# Patient Record
Sex: Female | Born: 1990 | Race: White | Hispanic: No | Marital: Single | Smoking: Never smoker
Health system: Southern US, Community
[De-identification: ages and names within clinical notes are randomized; demographics above are authoritative.]

## PROBLEM LIST (undated history)

## (undated) DIAGNOSIS — F419 Anxiety disorder, unspecified: Secondary | ICD-10-CM

## (undated) DIAGNOSIS — A499 Bacterial infection, unspecified: Secondary | ICD-10-CM

## (undated) DIAGNOSIS — N390 Urinary tract infection, site not specified: Secondary | ICD-10-CM

## (undated) DIAGNOSIS — E785 Hyperlipidemia, unspecified: Secondary | ICD-10-CM

## (undated) DIAGNOSIS — K859 Acute pancreatitis without necrosis or infection, unspecified: Secondary | ICD-10-CM

## (undated) DIAGNOSIS — E7800 Pure hypercholesterolemia, unspecified: Secondary | ICD-10-CM

## (undated) HISTORY — DX: Hyperlipidemia, unspecified: E78.5

## (undated) HISTORY — DX: Acute pancreatitis without necrosis or infection, unspecified: K85.90

## (undated) HISTORY — DX: Urinary tract infection, site not specified: A49.9

## (undated) HISTORY — DX: Anxiety disorder, unspecified: F41.9

## (undated) HISTORY — DX: Urinary tract infection, site not specified: N39.0

---

## 2010-10-29 ENCOUNTER — Inpatient Hospital Stay (HOSPITAL_COMMUNITY)
Admission: AD | Admit: 2010-10-29 | Discharge: 2010-10-30 | Disposition: A | Discharge status: Home / Self Care | Payer: 59 | Source: Ambulatory Visit | Attending: Obstetrics & Gynecology | Admitting: Obstetrics & Gynecology

## 2010-10-29 DIAGNOSIS — N949 Unspecified condition associated with female genital organs and menstrual cycle: Secondary | ICD-10-CM | POA: Insufficient documentation

## 2010-10-29 DIAGNOSIS — N938 Other specified abnormal uterine and vaginal bleeding: Secondary | ICD-10-CM | POA: Insufficient documentation

## 2010-10-29 LAB — URINALYSIS, ROUTINE W REFLEX MICROSCOPIC
Leukocytes, UA: NEGATIVE
Nitrite: NEGATIVE
Protein, ur: NEGATIVE mg/dL
Specific Gravity, Urine: 1.025 (ref 1.005–1.030)
Urobilinogen, UA: 1 mg/dL (ref 0.0–1.0)

## 2010-10-29 LAB — URINE MICROSCOPIC-ADD ON

## 2010-10-30 LAB — WET PREP, GENITAL: Yeast Wet Prep HPF POC: NONE SEEN

## 2010-11-01 LAB — GC/CHLAMYDIA PROBE AMP, GENITAL
Chlamydia, DNA Probe: NEGATIVE
GC Probe Amp, Genital: NEGATIVE

## 2010-12-19 ENCOUNTER — Inpatient Hospital Stay (HOSPITAL_COMMUNITY)
Admission: AD | Admit: 2010-12-19 | Discharge: 2010-12-19 | Disposition: A | Discharge status: Other Acute Inpatient Hospital | Payer: 59 | Source: Ambulatory Visit | Attending: Obstetrics and Gynecology | Admitting: Obstetrics and Gynecology

## 2010-12-19 ENCOUNTER — Inpatient Hospital Stay (HOSPITAL_COMMUNITY): Payer: 59

## 2010-12-19 ENCOUNTER — Inpatient Hospital Stay (HOSPITAL_COMMUNITY)
Admission: EM | Admit: 2010-12-19 | Discharge: 2010-12-24 | DRG: 440 | Disposition: A | Discharge status: Home / Self Care | Payer: 59 | Attending: Internal Medicine | Admitting: Internal Medicine

## 2010-12-19 DIAGNOSIS — R109 Unspecified abdominal pain: Secondary | ICD-10-CM | POA: Insufficient documentation

## 2010-12-19 DIAGNOSIS — R509 Fever, unspecified: Secondary | ICD-10-CM | POA: Diagnosis present

## 2010-12-19 DIAGNOSIS — K859 Acute pancreatitis without necrosis or infection, unspecified: Principal | ICD-10-CM | POA: Diagnosis present

## 2010-12-19 DIAGNOSIS — E781 Pure hyperglyceridemia: Secondary | ICD-10-CM | POA: Diagnosis present

## 2010-12-19 DIAGNOSIS — R197 Diarrhea, unspecified: Secondary | ICD-10-CM | POA: Diagnosis not present

## 2010-12-19 DIAGNOSIS — F341 Dysthymic disorder: Secondary | ICD-10-CM | POA: Diagnosis present

## 2010-12-19 DIAGNOSIS — D72829 Elevated white blood cell count, unspecified: Secondary | ICD-10-CM | POA: Diagnosis present

## 2010-12-19 LAB — COMPREHENSIVE METABOLIC PANEL
Alkaline Phosphatase: 50 U/L (ref 39–117)
BUN: 9 mg/dL (ref 6–23)
Chloride: 106 mEq/L (ref 96–112)
Creatinine, Ser: 0.68 mg/dL (ref 0.4–1.2)
Glucose, Bld: 122 mg/dL — ABNORMAL HIGH (ref 70–99)
Potassium: 4.1 mEq/L (ref 3.5–5.1)
Total Bilirubin: 0.6 mg/dL (ref 0.3–1.2)

## 2010-12-19 LAB — DIFFERENTIAL
Band Neutrophils: 4 % (ref 0–10)
Basophils Absolute: 0 10*3/uL (ref 0.0–0.1)
Basophils Relative: 0 % (ref 0–1)
Blasts: 0 %
Lymphocytes Relative: 13 % (ref 12–46)
Lymphs Abs: 2.6 10*3/uL (ref 0.7–4.0)
Monocytes Absolute: 0.2 10*3/uL (ref 0.1–1.0)
Monocytes Relative: 1 % — ABNORMAL LOW (ref 3–12)
Neutro Abs: 17.5 10*3/uL — ABNORMAL HIGH (ref 1.7–7.7)
Neutrophils Relative %: 82 % — ABNORMAL HIGH (ref 43–77)
Promyelocytes Absolute: 0 %

## 2010-12-19 LAB — URINE MICROSCOPIC-ADD ON

## 2010-12-19 LAB — CBC
MCH: 30.2 pg (ref 26.0–34.0)
MCHC: 35.6 g/dL (ref 30.0–36.0)
MCV: 84.7 fL (ref 78.0–100.0)
Platelets: 267 10*3/uL (ref 150–400)
RBC: 4.51 MIL/uL (ref 3.87–5.11)

## 2010-12-19 LAB — URINALYSIS, ROUTINE W REFLEX MICROSCOPIC
Glucose, UA: NEGATIVE mg/dL
Ketones, ur: >80 mg/dL — AB
Leukocytes, UA: NEGATIVE
Nitrite: NEGATIVE
Protein, ur: NEGATIVE mg/dL
Urobilinogen, UA: 0.2 mg/dL (ref 0.0–1.0)

## 2010-12-19 LAB — LIPASE, BLOOD: Lipase: 264 U/L — ABNORMAL HIGH (ref 11–59)

## 2010-12-19 LAB — POCT PREGNANCY, URINE: Preg Test, Ur: NEGATIVE

## 2010-12-19 LAB — RAPID URINE DRUG SCREEN, HOSP PERFORMED
Barbiturates: NOT DETECTED
Benzodiazepines: NOT DETECTED

## 2010-12-20 ENCOUNTER — Emergency Department (HOSPITAL_COMMUNITY): Payer: 59

## 2010-12-20 ENCOUNTER — Encounter (HOSPITAL_COMMUNITY): Payer: Self-pay | Admitting: Radiology

## 2010-12-20 DIAGNOSIS — K859 Acute pancreatitis without necrosis or infection, unspecified: Secondary | ICD-10-CM

## 2010-12-20 LAB — COMPREHENSIVE METABOLIC PANEL
AST: 20 U/L (ref 0–37)
CO2: 21 mEq/L (ref 19–32)
Chloride: 105 mEq/L (ref 96–112)
Creatinine, Ser: 0.8 mg/dL (ref 0.4–1.2)
GFR calc Af Amer: >60 mL/min (ref >60)
GFR calc non Af Amer: >60 mL/min (ref >60)
Glucose, Bld: 129 mg/dL — ABNORMAL HIGH (ref 70–99)
Total Bilirubin: 1 mg/dL (ref 0.3–1.2)

## 2010-12-20 LAB — LIPID PANEL
Cholesterol: 913 mg/dL — ABNORMAL HIGH (ref 0–200)
Triglycerides: 9164 mg/dL — ABNORMAL HIGH (ref <150)

## 2010-12-20 LAB — DIFFERENTIAL
Basophils Absolute: 0 10*3/uL (ref 0.0–0.1)
Eosinophils Absolute: 0 10*3/uL (ref 0.0–0.7)
Lymphocytes Relative: 12 % (ref 12–46)
Monocytes Absolute: 1.1 10*3/uL — ABNORMAL HIGH (ref 0.1–1.0)
Neutrophils Relative %: 81 % — ABNORMAL HIGH (ref 43–77)

## 2010-12-20 LAB — CBC
MCV: 86.4 fL (ref 78.0–100.0)
Platelets: 209 10*3/uL (ref 150–400)
RDW: 13.5 % (ref 11.5–15.5)
WBC: 16.3 10*3/uL — ABNORMAL HIGH (ref 4.0–10.5)

## 2010-12-20 LAB — PHOSPHORUS: Phosphorus: 3.5 mg/dL (ref 2.3–4.6)

## 2010-12-20 MED ORDER — IOHEXOL 300 MG/ML  SOLN
100.0000 mL | Freq: Once | INTRAMUSCULAR | Status: AC | PRN
  Administered 2010-12-20: 100 mL via INTRAVENOUS

## 2010-12-21 ENCOUNTER — Inpatient Hospital Stay (HOSPITAL_COMMUNITY): Payer: 59

## 2010-12-21 DIAGNOSIS — K859 Acute pancreatitis without necrosis or infection, unspecified: Secondary | ICD-10-CM

## 2010-12-21 LAB — CBC
Hemoglobin: 10.1 g/dL — ABNORMAL LOW (ref 12.0–15.0)
MCH: 29.9 pg (ref 26.0–34.0)
MCV: 89.6 fL (ref 78.0–100.0)
RBC: 3.38 MIL/uL — ABNORMAL LOW (ref 3.87–5.11)
WBC: 15.7 10*3/uL — ABNORMAL HIGH (ref 4.0–10.5)

## 2010-12-21 LAB — COMPREHENSIVE METABOLIC PANEL
AST: 31 U/L (ref 0–37)
Alkaline Phosphatase: 36 U/L — ABNORMAL LOW (ref 39–117)
BUN: 5 mg/dL — ABNORMAL LOW (ref 6–23)
CO2: 14 mEq/L — ABNORMAL LOW (ref 19–32)
Chloride: 111 mEq/L (ref 96–112)
Creatinine, Ser: 0.48 mg/dL (ref 0.4–1.2)
GFR calc non Af Amer: >60 mL/min (ref >60)
Potassium: 4.8 mEq/L (ref 3.5–5.1)
Total Bilirubin: 1.8 mg/dL — ABNORMAL HIGH (ref 0.3–1.2)

## 2010-12-21 LAB — MAGNESIUM: Magnesium: 2.2 mg/dL (ref 1.5–2.5)

## 2010-12-21 LAB — TRIGLYCERIDES: Triglycerides: 1321 mg/dL — ABNORMAL HIGH (ref <150)

## 2010-12-22 DIAGNOSIS — K859 Acute pancreatitis without necrosis or infection, unspecified: Secondary | ICD-10-CM

## 2010-12-22 LAB — URINE CULTURE
Culture  Setup Time: 201204040627
Culture: NO GROWTH
Special Requests: NEGATIVE

## 2010-12-22 LAB — BASIC METABOLIC PANEL
BUN: 1 mg/dL — ABNORMAL LOW (ref 6–23)
Chloride: 104 mEq/L (ref 96–112)
GFR calc Af Amer: >60 mL/min (ref >60)
GFR calc non Af Amer: >60 mL/min (ref >60)
Potassium: 3.8 mEq/L (ref 3.5–5.1)
Sodium: 132 mEq/L — ABNORMAL LOW (ref 135–145)

## 2010-12-22 LAB — DIFFERENTIAL
Basophils Absolute: 0 10*3/uL (ref 0.0–0.1)
Eosinophils Absolute: 0.2 10*3/uL (ref 0.0–0.7)
Eosinophils Relative: 1 % (ref 0–5)
Lymphs Abs: 2.9 10*3/uL (ref 0.7–4.0)
Neutrophils Relative %: 76 % (ref 43–77)

## 2010-12-22 LAB — CBC
MCV: 89.6 fL (ref 78.0–100.0)
Platelets: 189 10*3/uL (ref 150–400)
RBC: 3.65 MIL/uL — ABNORMAL LOW (ref 3.87–5.11)
RDW: 14.3 % (ref 11.5–15.5)
WBC: 17.1 10*3/uL — ABNORMAL HIGH (ref 4.0–10.5)

## 2010-12-23 LAB — CBC
Hemoglobin: 9.5 g/dL — ABNORMAL LOW (ref 12.0–15.0)
MCH: 28.1 pg (ref 26.0–34.0)
MCV: 90.2 fL (ref 78.0–100.0)
Platelets: 188 10*3/uL (ref 150–400)
RBC: 3.38 MIL/uL — ABNORMAL LOW (ref 3.87–5.11)
WBC: 13.3 10*3/uL — ABNORMAL HIGH (ref 4.0–10.5)

## 2010-12-24 LAB — BASIC METABOLIC PANEL
BUN: 4 mg/dL — ABNORMAL LOW (ref 6–23)
CO2: 26 mEq/L (ref 19–32)
Chloride: 102 mEq/L (ref 96–112)
GFR calc Af Amer: >60 mL/min (ref >60)
Potassium: 4.5 mEq/L (ref 3.5–5.1)

## 2010-12-24 LAB — CBC
Hemoglobin: 10.1 g/dL — ABNORMAL LOW (ref 12.0–15.0)
MCH: 27.6 pg (ref 26.0–34.0)
MCV: 88.8 fL (ref 78.0–100.0)
Platelets: 226 10*3/uL (ref 150–400)
RBC: 3.66 MIL/uL — ABNORMAL LOW (ref 3.87–5.11)
WBC: 16.6 10*3/uL — ABNORMAL HIGH (ref 4.0–10.5)

## 2010-12-24 LAB — LIPASE, BLOOD: Lipase: 70 U/L — ABNORMAL HIGH (ref 11–59)

## 2010-12-27 LAB — CULTURE, BLOOD (ROUTINE X 2)
Culture  Setup Time: 201204040907
Culture  Setup Time: 201204040907
Culture: NO GROWTH

## 2010-12-28 NOTE — H&P (Signed)
Julie Madden, Julie Madden                  ACCOUNT NO.:  [id number]  MEDICAL RECORD NO.:  [id number]           PATIENT TYPE:  E  LOCATION:  WLED                         FACILITY:  [facility]  PHYSICIAN:  Michiel Cowboy, MDDATE OF BIRTH:  November 04, 1990  DATE OF ADMISSION:  12/19/2010 DATE OF DISCHARGE:                             HISTORY & PHYSICAL   PRIMARY CARE PHYSICIAN:  None.  She goes usually for only OB-GYN care.  CHIEF COMPLAINT:  Abdominal pain.  HISTORY OF PRESENT ILLNESS:  The patient is a 20 year old female who developed sudden epigastric/right upper quadrant pain at 4 a.m. yesterday night and has been going all day.  She tried to eat something but that has made it only worse.  She had an episode of vomiting following this.  Eventually, she presented to [facility] where they did a pregnancy test on her and she was not pregnant and sent her over to the emergency department.  In the ED, she had a lipase done which was elevated at 260.  She got abdominal ultrasound which did not show any abnormality involving the pancreas but did show slight amount of pericholecystic fluid, although no abnormality within the gallbladder itself.  No biliary dilatation, at which point, [facility] hospitalist was called for admission.  At this point, the patient is going to go there for her CT scan to have further evaluation for pancreatitis.  REVIEW OF SYSTEMS:  She has not had any fever.  She had had nausea and vomiting.  No chest pains or shortness of breath.  Otherwise, review of systems is negative.  PAST MEDICAL HISTORY:  Significant for anxiety and depression.  SOCIAL HISTORY:  The patient denies alcohol or drug abuse.  Her last drink may have been 2 months ago if not more.  She does not smoke.  FAMILY HISTORY:  Noncontributory.  No coronary artery disease, no diabetes.  No history of pancreatitis.  ALLERGIES:  WELLBUTRIN.  MEDICATIONS:  She takes birth control pills currently.   She was taking Wellbutrin and Celexa, but  stopped.  PHYSICAL EXAMINATION:  VITALS:  Temperature 99.3, blood pressure 110/70, pulse 125, respirations 20, satting 98% room air. GENERAL:  The patient appears to be in no acute distress. HEENT:  Head nontraumatic.  Dry mucous membranes with normal skin turgor. LUNGS:  Clear to auscultation bilaterally. HEART:  Regular rate and rhythm.  No murmurs appreciated. ABDOMEN:  Soft.  There is epigastric tenderness as well as right-sided tenderness. LOWER EXTREMITIES:  Without clubbing, cyanosis, or edema. NEUROLOGIC:  Intact.  LABORATORY DATA:  White blood cell count elevated at 20, hemoglobin 13.6.  Sodium 138, potassium 4.1 creatinine 0.7.  LFTs within normal limits.  Albumin 3.5, calcium 7.8, lipase 264.  U tox unremarkable.  UA unremarkable.  KUB within normal limits.  US showing normal gallbladder wall with some pericholecystic fluid, but no abnormality of pancreas or gallbladder itself.  ASSESSMENT AND PLAN:  This is a 20 year old female with still possibility of pancreatitis given the epigastric pain and elevated lipase but to confirm this, I will obtain CT of her abdomen and pelvis to rule out  any other possibility.  We will make her n.p.o. except for ice chips and give aggressive intravenous fluids.  If CT scan of the abdomen showing any intra-abdominal infectious process, we will cover with antibiotics but for right now will hold off until CT scan is back but also if there is any evidence of necrotizing pancreatitis, we will also need to cover antibiotics as well. 1. Leukocytosis, could be combination of being reactive versus     hemoconcentration.  Watch for any source of infection. 2. Prophylaxis, Protonix and sequential compression devices.     Michiel Cowboy, MD     AVD/MEDQ  D:  12/20/2010  T:  12/20/2010  Job:  [id number]  Electronically Signed by Therisa Doyne MD on 12/28/2010 08:42:50 PM

## 2011-01-04 NOTE — Discharge Summary (Signed)
Julie Madden, Julie Madden                  ACCOUNT NO.:  [id number]  MEDICAL RECORD NO.:  [id number]           PATIENT TYPE:  I  LOCATION:  1343                         FACILITY:  [facility]  PHYSICIAN:  Clydia Llano, MD       DATE OF BIRTH:  1991-08-17  DATE OF ADMISSION:  12/19/2010 DATE OF DISCHARGE:  12/24/2010                              DISCHARGE SUMMARY   PRIMARY OBSTETRIC PHYSICIAN:  Zelphia Cairo, M.D.  REASON FOR ADMISSION:  Abdominal pain.  DISCHARGE DIAGNOSES: 1. Acute pancreatitis. 2. Hypertriglyceridemia. 3. Fever thought to be secondary to the pancreatitis.  DISCHARGE MEDICATIONS: 1. Lopid 600 mg p.o. b.i.d. before meals. 2. Oxycodone/acetaminophen 5/325 mg one to two tablets every 6 hours     as needed for pain. 3. Fish oil one capsule p.o. daily. 4. Clear Eyes OTC two drops in both eyes twice daily.  Stop taking the following medication: 1. Tri-Sprintec one tablet p.o. daily.  RADIOLOGY: 1. Chest x-ray April 4 showed linear opacity in the left lung base,     otherwise normal lungs. 2. CT of the abdomen and pelvis April 3 showed finding consistent with     acute pancreatitis with peripancreatic exudate, edema of the     ascending colon consistent with adjacent inflammatory process. 3. Abdominal ultrasound April 3 showed there is trace amount of     pericholecystic fluid.  The gallbladder is otherwise normal without     pain, stones, sludge or wall thickening.  BRIEF HISTORY AND EXAMINATION:  Julie Madden is a 20 year old female with no significant past medical history who came into the hospital because of abdominal pain.  The patient developed sudden epigastric right upper quadrant abdominal pain at 4:00 in the morning and yesterday night.  The patient came into the hospital complaining about the worsening of the pain.  The patient has nausea, vomiting and abdominal pain.  Initially she presented to the [facility] which a pregnancy test was done first and it  was negative.  Then the patient was transferred to [facility] for further evaluation.  Upon initial evaluation in the Emergency Department, lipase was found to be 260.  CT scan confirmed the presence of pancreatitis with no other abnormalities like cholecystitis or appendicitis.  The patient admitted for further evaluation.  BRIEF HOSPITAL COURSE: 1. The patient was admitted.  She was put n.p.o. for bowel rest.     Aggressive hydration with IV fluids were started.  Pain control     with IV pain medication was initiated as well.  Also the     Gastroenterology Service was consulted because of the patient's     young age.  The patient, with the n.p.o. her lipase was going down     nicely.  The abdominal pain as well as the nausea and the vomiting     also subsiding.  The patient was started on clear liquids and     upgraded slowly to a full diet.  On the date of discharge she is     tolerating a full diet since yesterday.  Her lipase is still  minimally high at 70, but the patient's pain all subsided.  The     patient does not drink, does not have any evidence of gallstones or     any increased LFTs.  Her lipid studies showed a triglyceride level     of 9164.  The acute pancreatitis was thought to be secondary to     hypertriglyceridemia. 2. Hypertriglyceridemia.  The patient does not have family history of     hypertriglyceridemia.  She is not diabetic.  Her TSH is normal.     The patient does not drink alcohol.  The only thing that could     alter the triglycerides is that the patient is on estrogen-     containing birth control pills.  The patient instructed not to take     it as well as she was started on Lopid in the hospital.  Her     triglycerides in one day went to 1300 and the day before discharge     it was 335.  The patient needs outpatient followup with her     triglycerides.  The patient will be on Lopid for one month and she     was instructed to follow up with  an Internal Medicine physician as     a primary care doctor.  She should stop the Lopid after one month     and check the triglycerides.  If the patient's triglycerides are     still high, the patient might need an endocrinologist referral for     further evaluation. 3. Fever.  While the patient was in the hospital, the patient     developed one spike of fever of 102.2 on April 4.  At that time     Gastroenterology was seeing the patient.  They recommended not to     use antibiotics and the patient's fever was thought to be related     to the pancreatitis.  The patient afterwards had a low-grade fever     of around 100 for one day.  On the day of discharge her temperature     is normal around 98.5.  DISCHARGE INSTRUCTIONS: 1. Diet:  Low-fat diet. 2. Activity:  As tolerated. 3. Disposition:  Home.     Clydia Llano, MD     ME/MEDQ  D:  12/24/2010  T:  12/24/2010  Job:  [id number]  cc:   Zelphia Cairo, MD Fax: [phone]  Electronically Signed by Clydia Llano  on 01/04/2011 03:49:16 PM

## 2011-01-06 ENCOUNTER — Encounter: Payer: Self-pay | Admitting: Internal Medicine

## 2011-01-06 ENCOUNTER — Ambulatory Visit (INDEPENDENT_AMBULATORY_CARE_PROVIDER_SITE_OTHER): Payer: 59 | Admitting: Internal Medicine

## 2011-01-06 VITALS — BP 106/78 | HR 80 | Temp 97.5°F | Wt 153.0 lb

## 2011-01-06 DIAGNOSIS — E781 Pure hyperglyceridemia: Secondary | ICD-10-CM

## 2011-01-06 MED ORDER — FLUOXETINE HCL 20 MG PO CAPS: 20.0000 mg | ORAL_CAPSULE | Freq: Every day | ORAL | Status: DC

## 2011-01-06 MED ORDER — ALPRAZOLAM 0.5 MG PO TABS: 0.5000 mg | ORAL_TABLET | Freq: Three times a day (TID) | ORAL | Status: DC | PRN

## 2011-01-06 NOTE — Progress Notes (Signed)
HPI: Julie Madden presents to establish for on-going primary care. She was recently hospitalized for pancreatitis secondary to extremely high triglycerides - 9,000+. She made an uneventful recovery. OCP's were thought to be the primary cause of her hypertrglyceridemia. She will be returning to her gyn for change in management of birth control.   Past Medical History  Diagnosis Date  . Anxiety     situational. Did not tolerate wellbutrin. Celexa caused myalgia  . Hyperlipidemia     triglycerides to 9000  . Urinary tract bacterial infections     frequent  . Metabolic pancreatitis    No past surgical history on file. Family History  Problem Relation Age of Onset  . Depression Mother     anxiety  . Cancer Mother     ovarian; gastric cancer  . Arthritis Father   . Asthma Sister   . Asthma Brother   . Diabetes Neg Hx   . Heart disease Neg Hx   . Hyperlipidemia Neg Hx   . Asthma Sister    History   Social History  . Marital Status: Single    Spouse Name: N/A    Number of Children: N/A  . Years of Education: N/A   Occupational History  . Not on file.   Social History Main Topics  . Smoking status: Never Smoker   . Smokeless tobacco: Not on file  . Alcohol Use: Not on file  . Drug Use: Not on file  . Sexually Active: Yes -- Female partner(s)   Other Topics Concern  . Not on file   Social History Narrative   HSG.  Single. 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     ROS General - negative. HEENT - wears glasses, near-sighted. No hearing trouble. No dnetal problems. Respiratory - negative. Heart - normal. GI- good appetite, healthy foods. Bowels regular. MSK - normal, has a click in the right knee.   Physical Exam: WNWD white woman in no distress HEENT- [place]/at, C&S clear with icterus, oropharynx witout lesions Chest - no deformity Lungs - Clear to exam Cor RRR, no murmurs Abdomen - BS+, soft, no guarding or rebound or significant tenderness.  Assessment and Plan 1.  Pancreatitis - patient was sent home on Lopid bid. She had an amylase of 300+ at discharge.  Plan - follow-up lab April 30th with recommendations to follow  2. Birth control - she will see her gynecologist to discuss alternative OCPs or other effective methods of birth control.  3. Chronic anxiety - she has failed celexa-GI problems and wellbutrin - adverse reaction. She admits to significant chronic anxiety with occasional panic.  Plan - fluoxetine 20mg  qd with follow-up in 4 weeks. She is to call for any adverse reaction or intolerance           Alprazolam 0.5 mg to take prn breakthrough anxiety and panic.   4. Health Maintenance - she has had recent thorough evaluation with no problems other than her pancreatitis and elevated triglycerides.

## 2011-01-16 ENCOUNTER — Other Ambulatory Visit (INDEPENDENT_AMBULATORY_CARE_PROVIDER_SITE_OTHER): Payer: 59

## 2011-01-16 DIAGNOSIS — E781 Pure hyperglyceridemia: Secondary | ICD-10-CM

## 2011-01-16 LAB — LIPID PANEL
Cholesterol: 195 mg/dL (ref 0–200)
HDL: 36.3 mg/dL — ABNORMAL LOW (ref >39.00)
LDL Cholesterol: 141 mg/dL — ABNORMAL HIGH (ref 0–99)
VLDL: 18 mg/dL (ref 0.0–40.0)

## 2011-01-16 LAB — HEPATIC FUNCTION PANEL
ALT: 22 U/L (ref 0–35)
Total Protein: 7.5 g/dL (ref 6.0–8.3)

## 2011-01-23 ENCOUNTER — Encounter: Payer: Self-pay | Admitting: Internal Medicine

## 2011-01-23 ENCOUNTER — Telehealth: Payer: Self-pay | Admitting: *Deleted

## 2011-01-23 NOTE — Telephone Encounter (Signed)
Labs were good, triglycerides were 90/. Letter went out today

## 2011-01-23 NOTE — Telephone Encounter (Signed)
Pt's mother left vm req results of last labs on 4/30. I don't believe that Mother is on Hippa - will call pt w/results, please advise.

## 2011-01-23 NOTE — Telephone Encounter (Signed)
Left mess to call office back.   

## 2011-01-24 NOTE — Telephone Encounter (Signed)
Patient informed. 

## 2011-02-06 ENCOUNTER — Ambulatory Visit: Payer: 59 | Admitting: Internal Medicine

## 2011-02-06 DIAGNOSIS — Z0289 Encounter for other administrative examinations: Secondary | ICD-10-CM

## 2011-02-28 ENCOUNTER — Telehealth: Payer: Self-pay | Admitting: *Deleted

## 2011-02-28 NOTE — Telephone Encounter (Signed)
Refill request from [facility] in [place], [place]. Alprazolam 0.5 mg qty 30 Directions: Take 1 tablet tid as needed for sleep and anxiety. Last fill was 02/05/2011. Please Advise refills

## 2011-02-28 NOTE — Telephone Encounter (Signed)
Ok for #60, 3 refills 

## 2011-03-02 MED ORDER — ALPRAZOLAM 0.5 MG PO TABS: 0.5000 mg | ORAL_TABLET | Freq: Three times a day (TID) | ORAL | Status: DC | PRN

## 2011-04-07 ENCOUNTER — Other Ambulatory Visit: Payer: Self-pay | Admitting: Internal Medicine

## 2011-05-02 ENCOUNTER — Ambulatory Visit: Payer: 59 | Admitting: Internal Medicine

## 2011-05-10 ENCOUNTER — Ambulatory Visit: Payer: 59 | Admitting: Internal Medicine

## 2011-05-31 ENCOUNTER — Telehealth: Payer: Self-pay | Admitting: *Deleted

## 2011-05-31 NOTE — Telephone Encounter (Signed)
Fax from [facility] In [place], [place] requesting refill on pts alprazolam 0.5 SIG take one tablet by mouth three times a day prn QTY 60 last fill date 05/02/2011

## 2011-05-31 NOTE — Telephone Encounter (Signed)
Ok for #90, 2 refills

## 2011-06-01 MED ORDER — ALPRAZOLAM 0.5 MG PO TABS: 0.5000 mg | ORAL_TABLET | Freq: Three times a day (TID) | ORAL | Status: DC | PRN

## 2011-08-28 ENCOUNTER — Ambulatory Visit: Payer: 59 | Admitting: Internal Medicine

## 2011-08-29 ENCOUNTER — Encounter: Payer: Self-pay | Admitting: Internal Medicine

## 2011-08-29 ENCOUNTER — Ambulatory Visit: Payer: 59

## 2011-08-29 ENCOUNTER — Ambulatory Visit (INDEPENDENT_AMBULATORY_CARE_PROVIDER_SITE_OTHER): Payer: 59 | Admitting: Internal Medicine

## 2011-08-29 DIAGNOSIS — K5900 Constipation, unspecified: Secondary | ICD-10-CM

## 2011-08-29 DIAGNOSIS — F411 Generalized anxiety disorder: Secondary | ICD-10-CM

## 2011-08-29 DIAGNOSIS — R110 Nausea: Secondary | ICD-10-CM

## 2011-08-29 DIAGNOSIS — E781 Pure hyperglyceridemia: Secondary | ICD-10-CM

## 2011-08-29 DIAGNOSIS — K859 Acute pancreatitis without necrosis or infection, unspecified: Secondary | ICD-10-CM | POA: Insufficient documentation

## 2011-08-29 DIAGNOSIS — R1013 Epigastric pain: Secondary | ICD-10-CM

## 2011-08-29 LAB — LDL CHOLESTEROL, DIRECT: Direct LDL: 93.6 mg/dL

## 2011-08-29 LAB — LIPASE: Lipase: 36 U/L (ref 11.0–59.0)

## 2011-08-29 LAB — HEPATIC FUNCTION PANEL
ALT: 15 U/L (ref 0–35)
AST: 16 U/L (ref 0–37)
Albumin: 4.4 g/dL (ref 3.5–5.2)
Alkaline Phosphatase: 61 U/L (ref 39–117)
Total Protein: 7.9 g/dL (ref 6.0–8.3)

## 2011-08-29 LAB — LIPID PANEL: Cholesterol: 196 mg/dL (ref 0–200)

## 2011-08-29 MED ORDER — GEMFIBROZIL 600 MG PO TABS: 600.0000 mg | ORAL_TABLET | Freq: Two times a day (BID) | ORAL | Status: DC

## 2011-08-29 MED ORDER — ALPRAZOLAM 0.5 MG PO TABS: 0.5000 mg | ORAL_TABLET | Freq: Three times a day (TID) | ORAL | Status: DC | PRN

## 2011-08-29 NOTE — Patient Instructions (Signed)
Hypertriglyceridemia - this is usually a chronic disease and off medication the levels will rise. You are now having abdominal pain that may be a flare of pancreatitis vs gastritis. Plan - lab: pancreatic enzymes and liver functions; restart lopid if Tgy is elevated. According to UpToDate - in the presence of high triglycerides triglycerides estrogen may lead to flare of pancreatitis. You should use alternative contraception, IUD, etc or the lowest possible dose of estrogen  Gastritis - this may be due to stress. Plan - take over the counter ranitidine 150 mg twice a day for 10 days or so.  Bowel habit - to regulate bowel habit try a bulk laxative at least once a day, e.g. Metamucil or benefiber, etc. Be sure to hydrate.  Will check pregnancy blood test to be sure.

## 2011-08-29 NOTE — Progress Notes (Signed)
Subjective:    Patient ID: Julie Madden, female    DOB: [birth year], 20 y.o.   MRN: [id number]  HPI Julie Madden presents for recurrent abdominal pain. She has hypertriglyceridemia with a h/o pancreatitis. She did well with lopid with Tgy to 90, but she has stopped medication. She has also changed to a fast food diet with a 19 lb weight gain. She was to [facility] for abdominal pain but was not admitted. She does report that her bowel habit is irregular: she is usually constipated but lately she has had watery stools.   She has had panic attacks, has been to hospital for this. She does take xanax on a prn basis. She is now living at home while unemployed and the stress factores are up. She was intolerant of prozac in the past - made her feel too drugged.  Past Medical History  Diagnosis Date  . Anxiety     situational. Did not tolerate wellbutrin. Celexa caused myalgia  . Hyperlipidemia     triglycerides to 9000  . Urinary tract bacterial infections     frequent  . Metabolic pancreatitis    No past surgical history on file. Family History  Problem Relation Age of Onset  . Depression Mother     anxiety  . Cancer Mother     ovarian; gastric cancer  . Arthritis Father   . Asthma Sister   . Asthma Brother   . Diabetes Neg Hx   . Heart disease Neg Hx   . Hyperlipidemia Neg Hx   . Asthma Sister    History   Social History  . Marital Status: Single    Spouse Name: N/A    Number of Children: N/A  . Years of Education: N/A   Occupational History  . Not on file.   Social History Main Topics  . Smoking status: Never Smoker   . Smokeless tobacco: Not on file  . Alcohol Use: Not on file  . Drug Use: Not on file  . Sexually Active: Yes -- Female partner(s)   Other Topics Concern  . Not on file   Social History Narrative   HSG.  Single. 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        Review of Systems System review is negative for any constitutional, cardiac, pulmonary, GI or  neuro symptoms or complaints other than as described in the HPI.     Objective:   Physical Exam Vitals reviewed - afebrile Gen'l- pleasant young white woman in no acute distress HEENT - C&S clear Neck- supple, no thyromegaly Cor - 2+ radial pulse, RRR Pulm - normal respirations Abd - BS+, diffuse tenderness upper abdomen most acute in the epigastrum, mild guarding, no rebound, no heptomegaly Neuro- A&O x 3, CN II-XII grossly normal, normal gait.  Lab Results  Component Value Date   ALT 15 08/29/2011   AST 16 08/29/2011   ALKPHOS 61 08/29/2011   BILITOT 0.4 08/29/2011        Amylase                79                                                                    08/29/2011  Lipase                   36                                                                    08/28/2101 Lab Results  Component Value Date   CHOL 196 08/29/2011   HDL 37.00* 08/29/2011   LDLCALC 141* 01/16/2011   LDLDIRECT 93.6 08/29/2011   TRIG 616.0* 08/29/2011   CHOLHDL 5 08/29/2011                Assessment & Plan:

## 2011-08-30 ENCOUNTER — Telehealth: Payer: Self-pay | Admitting: Internal Medicine

## 2011-08-30 ENCOUNTER — Ambulatory Visit: Payer: 59

## 2011-08-30 DIAGNOSIS — K5900 Constipation, unspecified: Secondary | ICD-10-CM | POA: Insufficient documentation

## 2011-08-30 DIAGNOSIS — R1013 Epigastric pain: Secondary | ICD-10-CM | POA: Insufficient documentation

## 2011-08-30 DIAGNOSIS — R110 Nausea: Secondary | ICD-10-CM

## 2011-08-30 DIAGNOSIS — F411 Generalized anxiety disorder: Secondary | ICD-10-CM | POA: Insufficient documentation

## 2011-08-30 NOTE — Assessment & Plan Note (Signed)
Diffuse abdominal pain but pancreatic enzymes are in normal range - no evidence of pancreatitis chemically.

## 2011-08-30 NOTE — Assessment & Plan Note (Signed)
Recent panic attack leading to ED visit. She did not tolerate SSRI and prefers prn medication. She has additional stressors being unemployed and having had to move back home.  Plan- renewed alprazolam

## 2011-08-30 NOTE — Assessment & Plan Note (Signed)
Patient with marked abdominal pain absent elevated enzymes to suggest pancreatitis. Suspect gastritis/dyspepsia as cause.  Plan- otc H2 blocker at full dose BID until pain is relieved.

## 2011-08-30 NOTE — Assessment & Plan Note (Signed)
Lab reveals elevated triglycerides to 600+, not as bad as in the past. Checked on effect of estrogens - can exacerbate rise in Tgy  Plan - resume lopid 600 bid           Contact gyn: alternative birth control, e.g.  IUD vs lowest possible dose of estrogen OCP

## 2011-08-30 NOTE — Telephone Encounter (Signed)
Called - spoke with mother: gave lab results and emphasized need to follow instructions

## 2011-08-30 NOTE — Assessment & Plan Note (Signed)
Patient with an irregular bowel habit, predominantly constipation. She does report several days of loose stools most recently.  Plan- recommended bulk laxative on a daily basis to regulate bowel habit.

## 2011-09-03 ENCOUNTER — Encounter: Payer: Self-pay | Admitting: Internal Medicine

## 2011-10-20 IMAGING — CT CT ABD-PELV W/ CM
2 of 4 series · 17 of 46 positions shown, 19 images · IV contrast (agent unspecified)
Comparison: Ultrasound abdomen of 12/20/2010

CLINICAL DATA: Right upper quadrant and epigastric pain, elevated
white blood cell count.

CT ABDOMEN AND PELVIS WITH CONTRAST
TECHNIQUE: Multidetector CT imaging of the abdomen and pelvis was
performed following the standard protocol during bolus
administration of intravenous contrast.
Contrast: 100 ml Xmnipaque-XMM

[Series 2: rtn a/p with · axial · 0.74mm/px · z∈[-612,-182]mm · 14 of 94 slices shown, 16 images]
[im 4/94  soft-tissue]
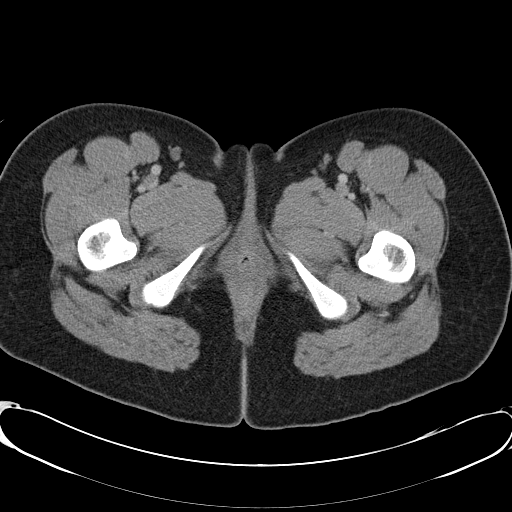
[im 4/94  bone]
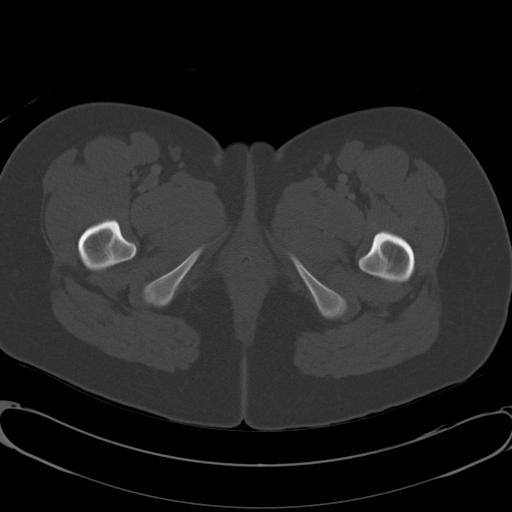
[im 11/94  soft-tissue]
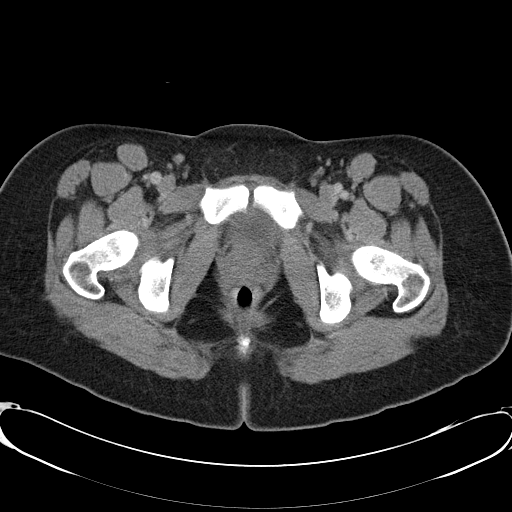
[im 18/94  soft-tissue]
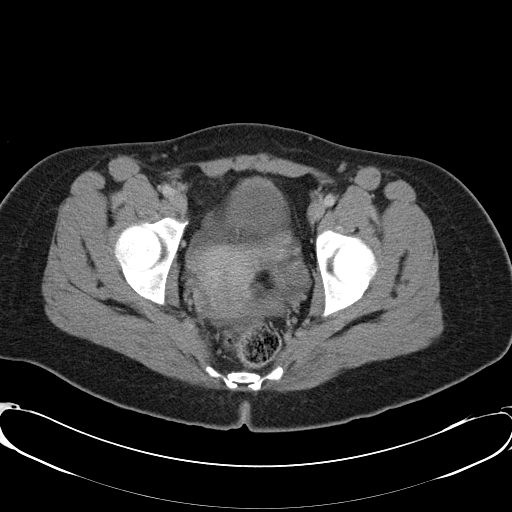
[im 26/94  soft-tissue]
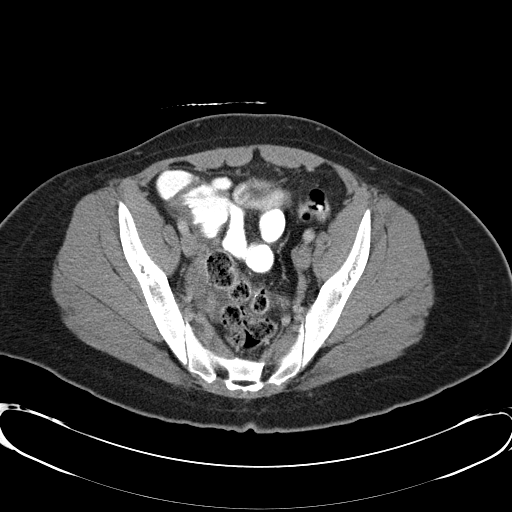
[im 33/94  soft-tissue]
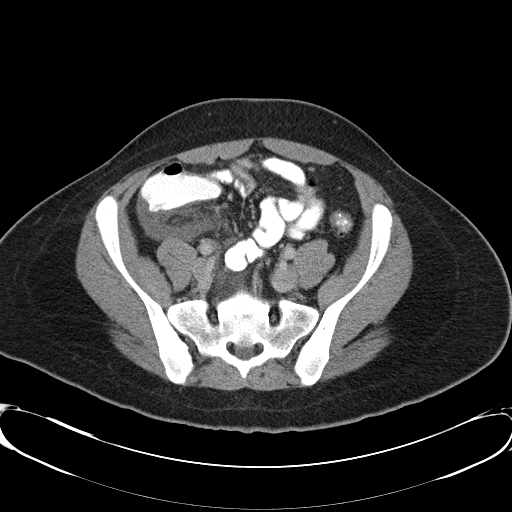
[im 36/94  soft-tissue]
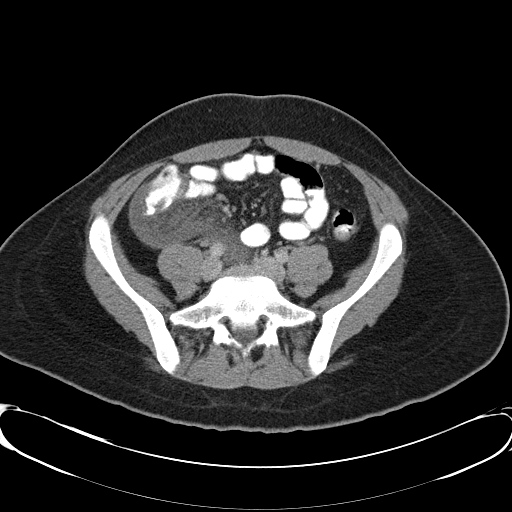
[im 43/94  soft-tissue]
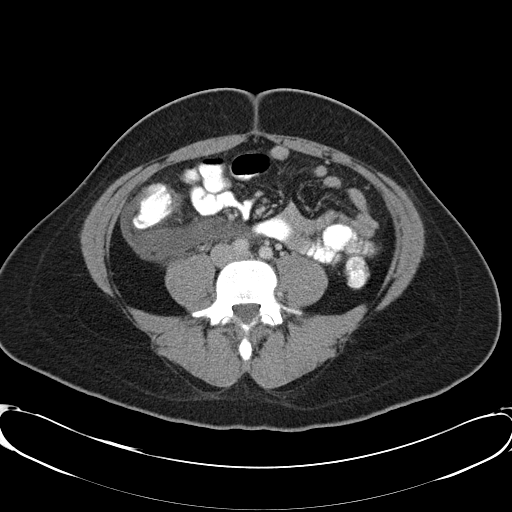
[im 51/94  soft-tissue]
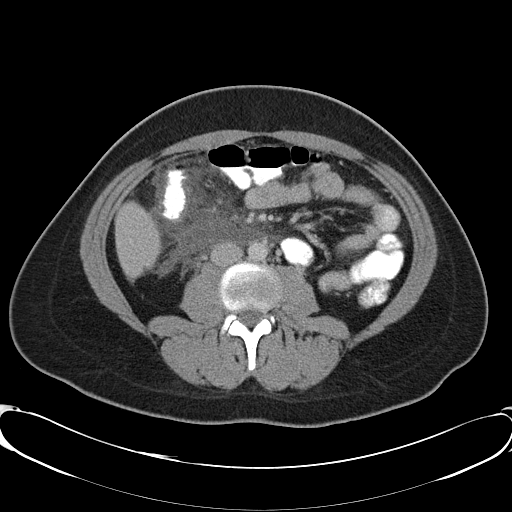
[im 58/94  soft-tissue]
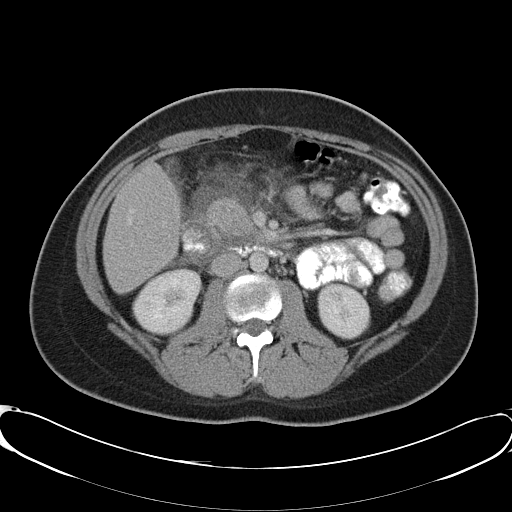
[im 58/94  bone]
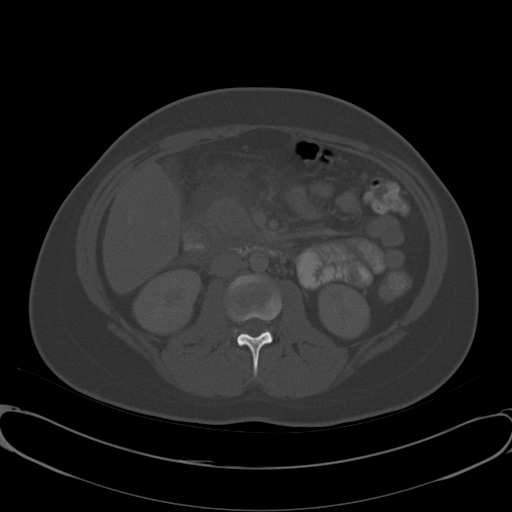
[im 61/94  soft-tissue]
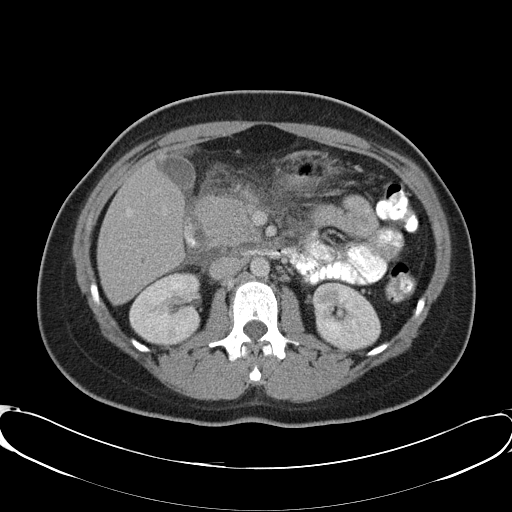
[im 68/94  soft-tissue]
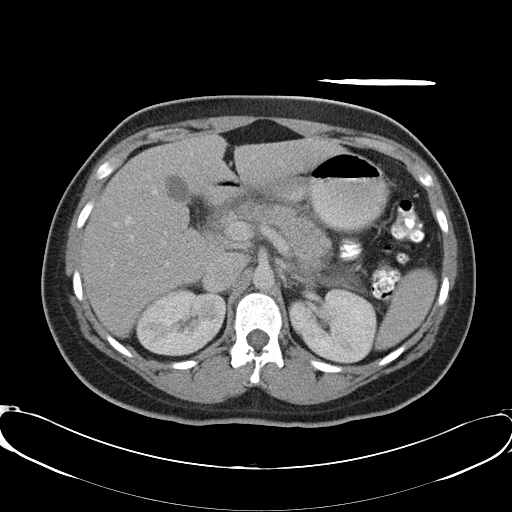
[im 76/94  soft-tissue]
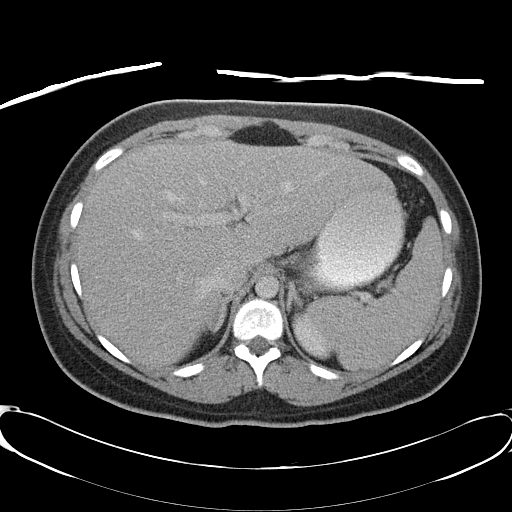
[im 83/94  soft-tissue]
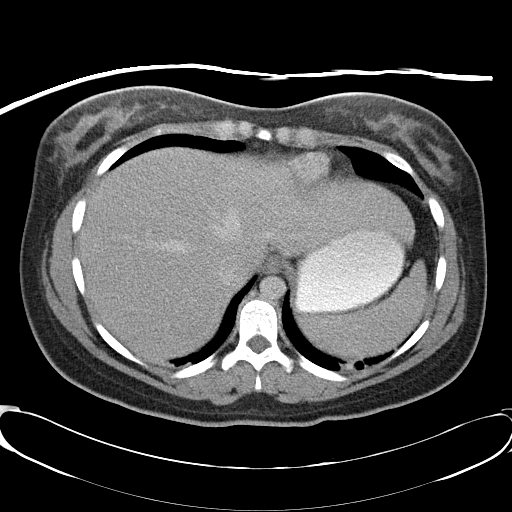
[im 90/94  soft-tissue]
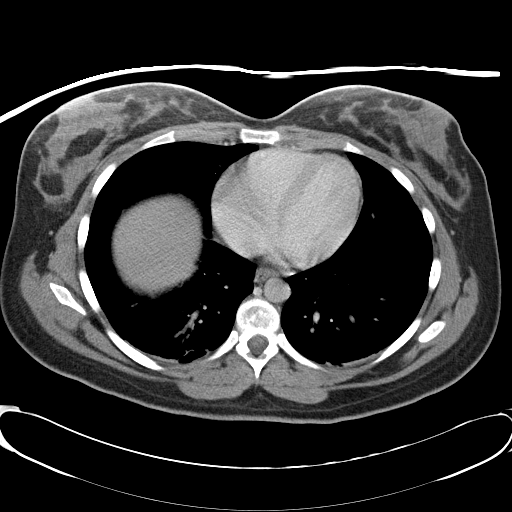

[Series 602: <mpr thick range> · coronal · 0.92mm/px · 3 of 82 slices shown]
[im 28/82  soft-tissue]
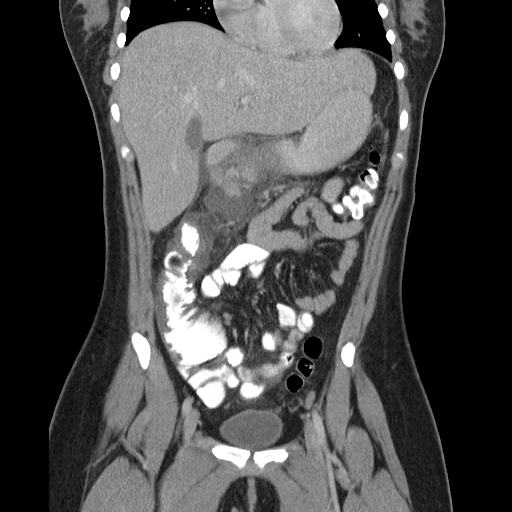
[im 37/82  soft-tissue]
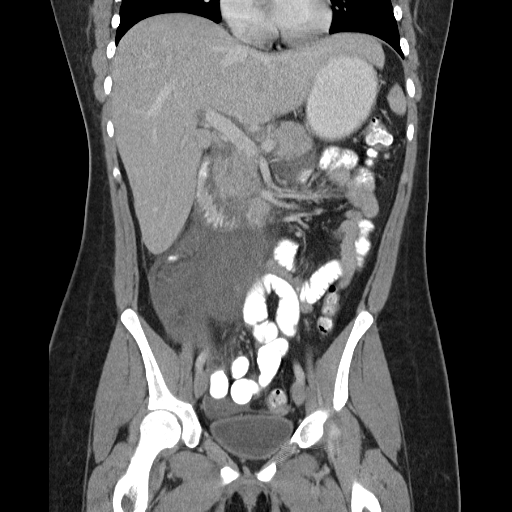
[im 46/82  soft-tissue]
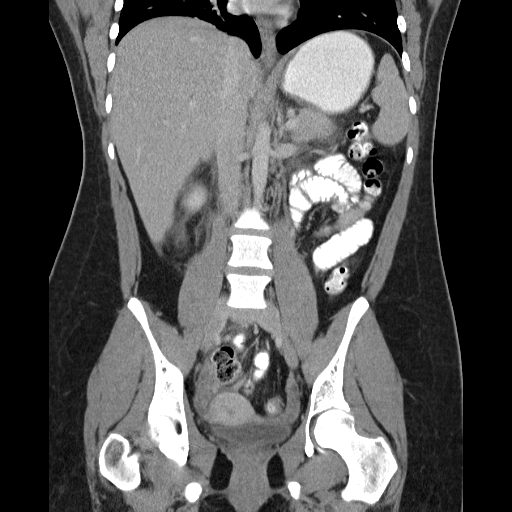

[17 of 46 positions shown; findings below may reference images not displayed]

FINDINGS: Mild basilar atelectasis is noted.  The liver enhances
with no focal abnormality and no ductal dilatation is seen.  No
calcified gallstones are seen.  However, the head of the pancreas
is prominent and there is considerable peripancreatic haziness
consistent with edema and exudate.  Some exudate extends into the
left anterior pararenal space.  These findings are consistent with
acute pancreatitis.  No pseudocyst is seen.  Phlegmon extends into
the right abdomen along the ascending colon where there is mild
mucosal edema present.  The adrenal glands and spleen are
unremarkable.  The stomach distends with contrast and is
unremarkable.  The kidneys enhance with no calculus or mass and no
hydronephrosis is seen.  The abdominal aorta is normal in caliber.

Phlegmon extends into the right lower quadrant.  The appendix is
better visualized on the coronal images and appears normal.  The
uterus is normal in size.  There appears to be a collapsing right
ovarian cyst present and there is a moderate amount of fluid
layering dependently within the pelvis.  The urinary bladder is
unremarkable.  No bony abnormality is seen
IMPRESSION: 1.  Findings consistent with acute pancreatitis with peripancreatic
exudate.
2.  Edema of the ascending colon consistent with adjacent
inflammatory process.

## 2011-12-12 ENCOUNTER — Other Ambulatory Visit: Payer: Self-pay | Admitting: *Deleted

## 2011-12-12 MED ORDER — ALPRAZOLAM 0.5 MG PO TABS: 0.5000 mg | ORAL_TABLET | Freq: Three times a day (TID) | ORAL | Status: DC | PRN

## 2011-12-12 NOTE — Telephone Encounter (Signed)
Alprazolam request [last refill 12.11.12 #[id number]]

## 2011-12-12 NOTE — Telephone Encounter (Signed)
Done

## 2011-12-12 NOTE — Telephone Encounter (Signed)
Ok for refill x 5 

## 2012-03-08 ENCOUNTER — Emergency Department (HOSPITAL_COMMUNITY)
Admission: EM | Admit: 2012-03-08 | Discharge: 2012-03-08 | Disposition: A | Discharge status: Home / Self Care | Payer: No Typology Code available for payment source | Attending: Emergency Medicine | Admitting: Emergency Medicine

## 2012-03-08 ENCOUNTER — Encounter (HOSPITAL_COMMUNITY): Payer: Self-pay | Admitting: Emergency Medicine

## 2012-03-08 DIAGNOSIS — M549 Dorsalgia, unspecified: Secondary | ICD-10-CM | POA: Insufficient documentation

## 2012-03-08 DIAGNOSIS — S139XXA Sprain of joints and ligaments of unspecified parts of neck, initial encounter: Secondary | ICD-10-CM | POA: Insufficient documentation

## 2012-03-08 DIAGNOSIS — R51 Headache: Secondary | ICD-10-CM

## 2012-03-08 DIAGNOSIS — Y92410 Unspecified street and highway as the place of occurrence of the external cause: Secondary | ICD-10-CM | POA: Insufficient documentation

## 2012-03-08 DIAGNOSIS — E785 Hyperlipidemia, unspecified: Secondary | ICD-10-CM | POA: Insufficient documentation

## 2012-03-08 DIAGNOSIS — F17200 Nicotine dependence, unspecified, uncomplicated: Secondary | ICD-10-CM | POA: Insufficient documentation

## 2012-03-08 DIAGNOSIS — S134XXA Sprain of ligaments of cervical spine, initial encounter: Secondary | ICD-10-CM

## 2012-03-08 MED ORDER — ACETAMINOPHEN-CODEINE #3 300-30 MG PO TABS
1.0000 | ORAL_TABLET | Freq: Once | ORAL | Status: AC
  Administered 2012-03-08: 1 via ORAL
  Filled 2012-03-08: qty 1

## 2012-03-08 MED ORDER — IBUPROFEN 800 MG PO TABS: 800.0000 mg | ORAL_TABLET | Freq: Three times a day (TID) | ORAL | Status: AC

## 2012-03-08 MED ORDER — IBUPROFEN 400 MG PO TABS
800.0000 mg | ORAL_TABLET | Freq: Once | ORAL | Status: AC
  Administered 2012-03-08: 800 mg via ORAL
  Filled 2012-03-08: qty 2

## 2012-03-08 MED ORDER — ACETAMINOPHEN-CODEINE #3 300-30 MG PO TABS: 1.0000 | ORAL_TABLET | Freq: Four times a day (QID) | ORAL | Status: AC | PRN

## 2012-03-08 MED ORDER — CYCLOBENZAPRINE HCL 10 MG PO TABS: 10.0000 mg | ORAL_TABLET | Freq: Two times a day (BID) | ORAL | Status: AC | PRN
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
## 2012-03-08 NOTE — ED Provider Notes (Signed)
Medical screening examination/treatment/procedure(s) were performed by non-physician practitioner and as supervising physician I was immediately available for consultation/collaboration.  Cheri Guppy, MD 03/08/12 [phone]

## 2012-03-13 ENCOUNTER — Telehealth: Payer: Self-pay | Admitting: Internal Medicine
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
## 2012-03-14 NOTE — Telephone Encounter (Signed)
Pt is at work.  Left message with her father to have her call us.

## 2012-03-18 ENCOUNTER — Ambulatory Visit (INDEPENDENT_AMBULATORY_CARE_PROVIDER_SITE_OTHER): Payer: 59 | Admitting: Internal Medicine

## 2012-03-18 ENCOUNTER — Encounter: Payer: Self-pay | Admitting: Internal Medicine

## 2012-03-18 VITALS — BP 104/64 | HR 80 | Temp 98.1°F | Resp 16 | Ht 65.0 in | Wt 145.0 lb

## 2012-03-18 DIAGNOSIS — S46919A Strain of unspecified muscle, fascia and tendon at shoulder and upper arm level, unspecified arm, initial encounter: Secondary | ICD-10-CM

## 2012-03-18 DIAGNOSIS — IMO0002 Reserved for concepts with insufficient information to code with codable children: Secondary | ICD-10-CM

## 2012-03-18 MED ORDER — CYCLOBENZAPRINE HCL 5 MG PO TABS: 5.0000 mg | ORAL_TABLET | Freq: Three times a day (TID) | ORAL | Status: AC | PRN

## 2012-03-18 NOTE — Patient Instructions (Addendum)
Upper back and muscle strain with tenderness to touch. Good range of motion and no signs to suggest disk injury or CNS injury.   Plan - flexeril 5 mg three times a day for muscle tightness/spasm.  Tylenol - generic acetominophen - 1000 mg ( 2 x 500 mg ) three times a day on schedule  Heat - Ben-Gay, Aspercreme, etc to sore muscles; hot showers; Bayer heat patches.  In regards to work - if it makes your pain a lot worse you will need to reduce the work load. This may take several weeks to resolve.   Muscle Strain A muscle strain, or pulled muscle, occurs when a muscle is over-stretched. A small number of muscle fibers may also be torn. This is especially common in athletes. This happens when a sudden violent force placed on a muscle pushes it past its capacity. Usually, recovery from a pulled muscle takes 1 to 2 weeks. But complete healing will take 5 to 6 weeks. There are millions of muscle fibers. Following injury, your body will usually return to normal quickly. HOME CARE INSTRUCTIONS    While awake, apply ice to the sore muscle for 15 to 20 minutes each hour for the first 2 days. Put ice in a plastic bag and place a towel between the bag of ice and your skin.   Do not use the pulled muscle for several days. Do not use the muscle if you have pain.   You may wrap the injured area with an elastic bandage for comfort. Be careful not to bind it too tightly. This may interfere with blood circulation.   Only take over-the-counter or prescription medicines for pain, discomfort, or fever as directed by your caregiver. Do not use aspirin as this will increase bleeding (bruising) at injury site.   Warming up before exercise helps prevent muscle strains.  SEEK MEDICAL CARE IF:   There is increased pain or swelling in the affected area. MAKE SURE YOU:    Understand these instructions.   Will watch your condition.   Will get help right away if you are not doing well or get worse.  Document  Released: 09/04/2005 Document Revised: 08/24/2011 Document Reviewed: 04/03/2007 [facility] Patient Information 2012 [place], [place].
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
## 2012-03-19 ENCOUNTER — Encounter: Payer: Self-pay | Admitting: Internal Medicine

## 2012-03-22 ENCOUNTER — Encounter: Payer: Self-pay | Admitting: Internal Medicine

## 2012-03-22 ENCOUNTER — Ambulatory Visit (INDEPENDENT_AMBULATORY_CARE_PROVIDER_SITE_OTHER): Payer: 59 | Admitting: Internal Medicine

## 2012-03-22 VITALS — BP 104/68 | HR 80 | Temp 97.6°F | Resp 16 | Wt 145.0 lb

## 2012-03-22 DIAGNOSIS — M7918 Myalgia, other site: Secondary | ICD-10-CM | POA: Insufficient documentation

## 2012-03-22 DIAGNOSIS — IMO0001 Reserved for inherently not codable concepts without codable children: Secondary | ICD-10-CM

## 2012-03-22 MED ORDER — NAPROXEN SODIUM ER 500 MG PO TB24: 500.0000 mg | ORAL_TABLET | Freq: Every day | ORAL | Status: DC

## 2012-03-22 NOTE — Patient Instructions (Signed)
Musculoskeletal Pain   Musculoskeletal pain is muscle and boney aches and pains. These pains can occur in any part of the body. Your caregiver may treat you without knowing the cause of the pain. They may treat you if blood or urine tests, X-rays, and other tests were normal.   CAUSES   There is often not a definite cause or reason for these pains. These pains may be caused by a type of germ (virus). The discomfort may also come from overuse. Overuse includes working out too hard when your body is not fit. Boney aches also come from weather changes. Bone is sensitive to atmospheric pressure changes.   HOME CARE INSTRUCTIONS   Ask when your test results will be ready. Make sure you get your test results.   Only take over-the-counter or prescription medicines for pain, discomfort, or fever as directed by your caregiver. If you were given medications for your condition, do not drive, operate machinery or power tools, or sign legal documents for 24 hours. Do not drink alcohol. Do not take sleeping pills or other medications that may interfere with treatment.   Continue all activities unless the activities cause more pain. When the pain lessens, slowly resume normal activities. Gradually increase the intensity and duration of the activities or exercise.   During periods of severe pain, bed rest may be helpful. Lay or sit in any position that is comfortable.   Putting ice on the injured area.   Put ice in a bag.   Place a towel between your skin and the bag.   Leave the ice on for 15 to 20 minutes, 3 to 4 times a day.   Follow up with your caregiver for continued problems and no reason can be found for the pain. If the pain becomes worse or does not go away, it may be necessary to repeat tests or do additional testing. Your caregiver may need to look further for a possible cause.   SEEK IMMEDIATE MEDICAL CARE IF:   You have pain that is getting worse and is not relieved by medications.   You develop chest pain that is  associated with shortness or breath, sweating, feeling sick to your stomach (nauseous), or throw up (vomit).   Your pain becomes localized to the abdomen.   You develop any new symptoms that seem different or that concern you.   MAKE SURE YOU:   Understand these instructions.   Will watch your condition.   Will get help right away if you are not doing well or get worse.   Document Released: 09/04/2005 Document Revised: 08/24/2011 Document Reviewed: 04/24/2008   [employer]® Patient Information ©2012 [employer].
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
## 2012-03-22 NOTE — Assessment & Plan Note (Signed)
Form completed for work restrictions, she can try some nsaids, activities as tolerated

## 2012-04-21 ENCOUNTER — Encounter (HOSPITAL_COMMUNITY): Payer: Self-pay | Admitting: *Deleted

## 2012-04-21 ENCOUNTER — Emergency Department (HOSPITAL_COMMUNITY)
Admission: EM | Admit: 2012-04-21 | Discharge: 2012-04-21 | Disposition: A | Discharge status: Home / Self Care | Payer: 59 | Attending: Emergency Medicine | Admitting: Emergency Medicine

## 2012-04-21 DIAGNOSIS — B9689 Other specified bacterial agents as the cause of diseases classified elsewhere: Secondary | ICD-10-CM | POA: Insufficient documentation

## 2012-04-21 DIAGNOSIS — K625 Hemorrhage of anus and rectum: Secondary | ICD-10-CM | POA: Insufficient documentation

## 2012-04-21 DIAGNOSIS — A499 Bacterial infection, unspecified: Secondary | ICD-10-CM | POA: Insufficient documentation

## 2012-04-21 DIAGNOSIS — F411 Generalized anxiety disorder: Secondary | ICD-10-CM | POA: Insufficient documentation

## 2012-04-21 DIAGNOSIS — Z79899 Other long term (current) drug therapy: Secondary | ICD-10-CM | POA: Insufficient documentation

## 2012-04-21 DIAGNOSIS — N760 Acute vaginitis: Secondary | ICD-10-CM | POA: Insufficient documentation

## 2012-04-21 DIAGNOSIS — Z91011 Allergy to milk products: Secondary | ICD-10-CM | POA: Insufficient documentation

## 2012-04-21 DIAGNOSIS — E785 Hyperlipidemia, unspecified: Secondary | ICD-10-CM | POA: Insufficient documentation

## 2012-04-21 LAB — POCT PREGNANCY, URINE: Preg Test, Ur: NEGATIVE

## 2012-04-21 LAB — CBC
HCT: 41.5 % (ref 36.0–46.0)
Hemoglobin: 14.5 g/dL (ref 12.0–15.0)
MCV: 87.7 fL (ref 78.0–100.0)
RBC: 4.73 MIL/uL (ref 3.87–5.11)
WBC: 12.4 10*3/uL — ABNORMAL HIGH (ref 4.0–10.5)

## 2012-04-21 LAB — COMPREHENSIVE METABOLIC PANEL
AST: 20 U/L (ref 0–37)
BUN: 8 mg/dL (ref 6–23)
CO2: 27 mEq/L (ref 19–32)
Chloride: 101 mEq/L (ref 96–112)
Creatinine, Ser: 0.6 mg/dL (ref 0.50–1.10)
GFR calc Af Amer: >90 mL/min (ref >90)
GFR calc non Af Amer: >90 mL/min (ref >90)
Glucose, Bld: 96 mg/dL (ref 70–99)
Total Bilirubin: 0.6 mg/dL (ref 0.3–1.2)

## 2012-04-21 LAB — URINALYSIS, ROUTINE W REFLEX MICROSCOPIC
Hgb urine dipstick: NEGATIVE
Protein, ur: NEGATIVE mg/dL
Urobilinogen, UA: 1 mg/dL (ref 0.0–1.0)

## 2012-04-21 LAB — OCCULT BLOOD, POC DEVICE: Fecal Occult Bld: POSITIVE

## 2012-04-21 LAB — WET PREP, GENITAL

## 2012-04-21 MED ORDER — METRONIDAZOLE 500 MG PO TABS: 500.0000 mg | ORAL_TABLET | Freq: Two times a day (BID) | ORAL | Status: AC

## 2012-04-21 NOTE — ED Notes (Signed)
Pt given discharge and follow up instructions after speaking with provider. Denies further needs at this time. Ambulates to lobby in NAD  

## 2012-04-21 NOTE — ED Notes (Signed)
Pt reports having LLQ pain for extended amount of time, went to have bowel movement today and had bright red bleeding x1. No acute distress noted at triage.

## 2012-04-21 NOTE — ED Provider Notes (Signed)
History     CSN: [id number]  Arrival date & time 04/21/12  4098   First MD Initiated Contact with Patient 04/21/12 2028      Chief Complaint  Patient presents with  . Abdominal Pain  . Rectal Bleeding    (Consider location/radiation/quality/duration/timing/severity/associated sxs/prior treatment) Patient is a 21 y.o. female presenting with abdominal pain. The history is provided by the patient.  Abdominal Pain The primary symptoms of the illness include abdominal pain, nausea, hematochezia and vaginal discharge. The primary symptoms of the illness do not include fever, vomiting, diarrhea, hematemesis, dysuria or vaginal bleeding. The current episode started more than 2 days ago (1 month). The onset of the illness was gradual. The problem has not changed since onset. The abdominal pain began more than 2 days ago (1 month). The pain came on gradually. The abdominal pain has been unchanged since its onset. The abdominal pain is located in the LUQ and LLQ. The abdominal pain does not radiate. The severity of the abdominal pain is 3/10. The abdominal pain is relieved by nothing. The abdominal pain is exacerbated by movement.  The hematochezia began today. The hematochezia has occurred 2 to 5 times per day (2). The hematochezia is a new problem.  The vaginal discharge was first noticed more than 2 days ago. Vaginal discharge is a new problem. The patient believes that the vaginal discharge is unchanged since it began. The amount of discharge is variable. The color of the discharge is white. The vaginal discharge is not associated with dysuria.   The patient states that she believes she is currently not pregnant. The patient has not had a change in bowel habit. Symptoms associated with the illness do not include chills, diaphoresis, constipation, urgency, hematuria, frequency or back pain.    Past Medical History  Diagnosis Date  . Anxiety     situational. Did not tolerate wellbutrin. Celexa  caused myalgia  . Hyperlipidemia     triglycerides to 9000  . Urinary tract bacterial infections     frequent  . Metabolic pancreatitis     History reviewed. No pertinent past surgical history.  Family History  Problem Relation Age of Onset  . Depression Mother     anxiety  . Cancer Mother     ovarian; gastric cancer  . Arthritis Father   . Asthma Sister   . Asthma Brother   . Diabetes Neg Hx   . Heart disease Neg Hx   . Hyperlipidemia Neg Hx   . Asthma Sister     History  Substance Use Topics  . Smoking status: Never Smoker   . Smokeless tobacco: Never Used  . Alcohol Use: No    OB History    Grav Para Term Preterm Abortions TAB SAB Ect Mult Living                  Review of Systems  Constitutional: Negative for fever, chills and diaphoresis.  HENT: Negative.   Respiratory: Negative.   Cardiovascular: Negative.   Gastrointestinal: Positive for nausea, abdominal pain, blood in stool and hematochezia. Negative for vomiting, diarrhea, constipation and hematemesis.  Genitourinary: Positive for vaginal discharge. Negative for dysuria, urgency, frequency, hematuria, flank pain, vaginal bleeding and vaginal pain.  Musculoskeletal: Negative for back pain.  All other systems reviewed and are negative.    Allergies  Wellbutrin  Home Medications   Current Outpatient Rx  Name Route Sig Dispense Refill  . GEMFIBROZIL 600 MG PO TABS Oral Take 600 mg by  mouth 2 (two) times daily before a meal.    . METRONIDAZOLE 500 MG PO TABS Oral Take 1 tablet (500 mg total) by mouth 2 (two) times daily. 14 tablet 0    BP 94/77  Pulse 86  Temp 97.8 F (36.6 C) (Oral)  Resp 18  SpO2 100%  LMP 04/08/2012  Physical Exam  Nursing note and vitals reviewed. Constitutional: She is oriented to person, place, and time. She appears well-developed and well-nourished. No distress.  HENT:  Head: Normocephalic and atraumatic.  Eyes: Conjunctivae are normal.  Neck: Neck supple.    Cardiovascular: Normal rate, regular rhythm, normal heart sounds and intact distal pulses.   Pulmonary/Chest: Effort normal and breath sounds normal. She has no wheezes. She has no rales.  Abdominal: Soft. She exhibits no distension. There is tenderness in the suprapubic area, left upper quadrant and left lower quadrant. There is no rigidity and no CVA tenderness.  Genitourinary: Cervix exhibits no motion tenderness. Right adnexum displays no mass, no tenderness and no fullness. Left adnexum displays no mass, no tenderness and no fullness. Vaginal discharge (white) found.  Musculoskeletal: Normal range of motion.  Neurological: She is alert and oriented to person, place, and time.  Skin: Skin is warm and dry.    ED Course  Procedures (including critical care time)  Labs Reviewed  CBC - Abnormal; Notable for the following:    WBC 12.4 (*)     All other components within normal limits  COMPREHENSIVE METABOLIC PANEL - Abnormal; Notable for the following:    Total Protein 8.7 (*)     All other components within normal limits  WET PREP, GENITAL - Abnormal; Notable for the following:    Clue Cells Wet Prep HPF POC FEW (*)     WBC, Wet Prep HPF POC FEW (*)     All other components within normal limits  URINALYSIS, ROUTINE W REFLEX MICROSCOPIC  POCT PREGNANCY, URINE  LIPASE, BLOOD  OCCULT BLOOD, POC DEVICE  GC/CHLAMYDIA PROBE AMP, GENITAL   No results found.   1. Bacterial vaginosis   2. Bright red blood per rectum       MDM  21 yo female with one month history of left sided abdominal pain today with bright red blood mixed with stool.  Pain is sharp, waxing and waning, currently rated 3/10.  Associated sx of vaginal discharge.  AF, VSS, NAD at presentation.  Physical exam with LUQ, LLQ and suprapubic TTP w/o guarding or rebound.  Pelvic exam with white discharge.  No CMT, adnexal fullness or mass.  No signs of PID or torsion.  CBC with Hgb of 14.5 and WBC 12.4.  UPT negative.  UA  negative for UTI.  Lipase wnl.  No signs of pancreatitis.  Wet prep consistent with BV.  Hemoccult positive, but pt without signs of anemia.  Will discharge home with course of Flagyl and provide Gastroenterology referral.  Dx and Tx plan discussed with pt who voiced understanding and will follow-up.  Return precautions provided.      Cherre Robins, MD 04/22/12 0020

## 2012-04-23 LAB — GC/CHLAMYDIA PROBE AMP, GENITAL: Chlamydia, DNA Probe: NEGATIVE

## 2012-04-23 NOTE — ED Provider Notes (Signed)
I saw and evaluated the patient, reviewed the resident's note and I agree with the findings and plan.  Becky Berberian T Dewane Timson, MD 04/23/12 0832 

## 2012-06-13 ENCOUNTER — Other Ambulatory Visit: Payer: Self-pay | Admitting: *Deleted

## 2012-06-13 NOTE — Telephone Encounter (Signed)
Pharmacy and patient request refill on medication alprazolam 0.5mg  3 x day as needed for break through anxiety. Last filled 05/13/2012. Please advise Dr. Debby Bud patient.

## 2012-06-14 MED ORDER — ALPRAZOLAM 0.5 MG PO TABS: 0.5000 mg | ORAL_TABLET | Freq: Three times a day (TID) | ORAL | Status: DC | PRN

## 2012-06-14 NOTE — Telephone Encounter (Signed)
Ok to refill 30d, no refill (per protocol covering for absent PCP) - prescription printed and signed - placed on your desk  

## 2012-06-14 NOTE — Telephone Encounter (Signed)
Rx faxed to [facility].  

## 2012-06-14 NOTE — Telephone Encounter (Signed)
Rx printed, awaiting MD's signature.  

## 2012-06-20 ENCOUNTER — Other Ambulatory Visit: Payer: Self-pay | Admitting: Gastroenterology

## 2012-06-20 DIAGNOSIS — R109 Unspecified abdominal pain: Secondary | ICD-10-CM

## 2012-06-25 ENCOUNTER — Other Ambulatory Visit: Payer: 59

## 2012-07-04 ENCOUNTER — Emergency Department (HOSPITAL_COMMUNITY): Payer: 59

## 2012-07-04 ENCOUNTER — Emergency Department (HOSPITAL_COMMUNITY)
Admission: EM | Admit: 2012-07-04 | Discharge: 2012-07-04 | Disposition: A | Discharge status: Home / Self Care | Payer: 59 | Attending: Emergency Medicine | Admitting: Emergency Medicine

## 2012-07-04 ENCOUNTER — Encounter (HOSPITAL_COMMUNITY): Payer: Self-pay | Admitting: Emergency Medicine

## 2012-07-04 DIAGNOSIS — Z888 Allergy status to other drugs, medicaments and biological substances status: Secondary | ICD-10-CM | POA: Insufficient documentation

## 2012-07-04 DIAGNOSIS — M940 Chondrocostal junction syndrome [Tietze]: Secondary | ICD-10-CM | POA: Insufficient documentation

## 2012-07-04 DIAGNOSIS — E785 Hyperlipidemia, unspecified: Secondary | ICD-10-CM | POA: Insufficient documentation

## 2012-07-04 DIAGNOSIS — F411 Generalized anxiety disorder: Secondary | ICD-10-CM | POA: Insufficient documentation

## 2012-07-04 DIAGNOSIS — K859 Acute pancreatitis without necrosis or infection, unspecified: Secondary | ICD-10-CM | POA: Insufficient documentation

## 2012-07-04 HISTORY — DX: Pure hypercholesterolemia, unspecified: E78.00

## 2012-07-04 MED ORDER — OXYCODONE-ACETAMINOPHEN 5-325 MG PO TABS
2.0000 | ORAL_TABLET | Freq: Once | ORAL | Status: AC
  Administered 2012-07-04: 2 via ORAL
  Filled 2012-07-04: qty 2

## 2012-07-04 MED ORDER — ONDANSETRON 4 MG PO TBDP
4.0000 mg | ORAL_TABLET | Freq: Once | ORAL | Status: AC
  Administered 2012-07-04: 4 mg via ORAL
  Filled 2012-07-04: qty 1

## 2012-07-04 MED ORDER — TRAMADOL HCL 50 MG PO TABS: 50.0000 mg | ORAL_TABLET | Freq: Four times a day (QID) | ORAL | Status: AC | PRN

## 2012-07-04 NOTE — ED Notes (Signed)
Patient claims LUQ pain with associated SOB.  Patient claims that she has trouble when trying to eat.  "My ribs hurt".

## 2012-07-04 NOTE — ED Notes (Signed)
Patient resting quietly, calm with unlabored respirations.  

## 2012-07-04 NOTE — ED Provider Notes (Signed)
Medical screening examination/treatment/procedure(s) were performed by non-physician practitioner and as supervising physician I was immediately available for consultation/collaboration.   Amberleigh Gerken W Eisa Necaise, MD 07/04/12 1622 

## 2012-07-04 NOTE — ED Provider Notes (Signed)
History     CSN: [id number]  Arrival date & time 07/04/12  7829   First MD Initiated Contact with Patient 07/04/12 [phone]      Chief Complaint  Patient presents with  . Abdominal Pain    (Consider location/radiation/quality/duration/timing/severity/associated sxs/prior treatment) HPI Pt to the ER with left lower rib pain and tenderness. The pain started two days ago and hurts if she bends, twists, sits up, takes in a large breath or touches it. She denies feeling short of breath when I ask her about it. She denies that her abdomen hurts or that she has has N/V/D, constipation, abdominal distention or fevers. She has had pancreatitis in the past and this does not feel the same at all. No back/flank pain, vaginal discharge or dysuria. She has had coughing for the past week and she was coughing when she first felt the pain. In triage the patient is in NAD/vss.   Past Medical History  Diagnosis Date  . Anxiety     situational. Did not tolerate wellbutrin. Celexa caused myalgia  . Hyperlipidemia     triglycerides to 9000  . Urinary tract bacterial infections     frequent  . Metabolic pancreatitis   . Hypercholesterolemia     History reviewed. No pertinent past surgical history.  Family History  Problem Relation Age of Onset  . Depression Mother     anxiety  . Cancer Mother     ovarian; gastric cancer  . Arthritis Father   . Asthma Sister   . Asthma Brother   . Diabetes Neg Hx   . Heart disease Neg Hx   . Hyperlipidemia Neg Hx   . Asthma Sister     History  Substance Use Topics  . Smoking status: Never Smoker   . Smokeless tobacco: Never Used  . Alcohol Use: No    OB History    Grav Para Term Preterm Abortions TAB SAB Ect Mult Living                  Review of Systems   Review of Systems  Gen: no weight loss, fevers, chills, night sweats  Eyes: no discharge or drainage, no occular pain or visual changes  Nose: no epistaxis or rhinorrhea  Mouth: no dental  pain, no sore throat  Neck: no neck pain  Lungs:No wheezing, coughing or hemoptysis CV: no chest pain, palpitations, dependent edema or orthopnea, LL rib pain Abd: no abdominal pain, nausea, vomiting  GU: no dysuria or gross hematuria  MSK:  No abnormalities  Neuro: no headache, no focal neurologic deficits  Skin: no abnormalities Psyche: negative.    Allergies  Wellbutrin  Home Medications   Current Outpatient Rx  Name Route Sig Dispense Refill  . ALPRAZOLAM 0.5 MG PO TABS Oral Take 1 tablet (0.5 mg total) by mouth 3 (three) times daily as needed. For breakthrough anxiety 90 tablet 0  . GEMFIBROZIL 600 MG PO TABS Oral Take 600 mg by mouth 2 (two) times daily before a meal.    . PEARLS IC PO CAPS Oral Take 1 capsule by mouth daily.      BP 110/74  Pulse 86  Temp 97.9 F (36.6 C) (Oral)  Resp 20  SpO2 97%  LMP 06/04/2012  Physical Exam  Nursing note and vitals reviewed. Constitutional: She appears well-developed and well-nourished. No distress. She is not intubated.  HENT:  Head: Normocephalic and atraumatic.  Eyes: Pupils are equal, round, and reactive to light.  Neck: Normal range  of motion. Neck supple.  Cardiovascular: Normal rate and regular rhythm.   Pulmonary/Chest: Effort normal. No accessory muscle usage. No apnea and not bradypneic. She is not intubated. No respiratory distress. Chest wall is not dull to percussion. She exhibits tenderness and bony tenderness. She exhibits no mass, no laceration, no crepitus, no edema, no deformity, no swelling and no retraction.    Abdominal: Soft. She exhibits no distension and no mass. There is no tenderness. There is no rebound and no guarding.  Neurological: She is alert.  Skin: Skin is warm and dry.    ED Course  Procedures (including critical care time)  Labs Reviewed - No data to display Dg Chest 2 View  07/04/2012  *RADIOLOGY REPORT*  Clinical Data: Lower anterior rib pain for 2 days, no known injury  CHEST - 2  VIEW  Comparison: 12/21/2010  Findings: Normal heart size, mediastinal contours, and pulmonary vascularity. Lungs clear. No pleural effusion or pneumothorax. Bones unremarkable.  IMPRESSION: No acute abnormalities.   Original Report Authenticated By: Lollie Marrow, M.D.      1. Costochondritis       MDM  Pt has boney tenderness when I touch rib. When asked to twist from left to right or cough, it hurts this same area. Pain started after a strong "coughing fit". Chest xray normal. Low risk for cardiac disease, perc negative. Will refer back to her PCP, Dr. Debby Bud.  Tramadol Rx.  Pt has been advised of the symptoms that warrant their return to the ED. Patient has voiced understanding and has agreed to follow-up with the PCP or specialist.         Dorthula Matas, PA 07/04/12 [phone]

## 2012-07-15 ENCOUNTER — Other Ambulatory Visit: Payer: Self-pay | Admitting: *Deleted

## 2012-07-15 MED ORDER — ALPRAZOLAM 0.5 MG PO TABS: 0.5000 mg | ORAL_TABLET | Freq: Three times a day (TID) | ORAL | Status: DC | PRN

## 2012-07-15 NOTE — Telephone Encounter (Signed)
Faxed script back to [facility].../lmb 

## 2012-07-15 NOTE — Telephone Encounter (Signed)
Received fax from [facility] pt requesting renewal on alprazolam 0.5mg . Last filled 06/14/12. Is this ok to refill?...Raechel Chute

## 2012-07-23 ENCOUNTER — Inpatient Hospital Stay: Admission: RE | Admit: 2012-07-23 | Payer: 59 | Source: Ambulatory Visit

## 2012-08-19 ENCOUNTER — Other Ambulatory Visit: Payer: Self-pay | Admitting: *Deleted

## 2012-08-23 ENCOUNTER — Other Ambulatory Visit: Payer: Self-pay | Admitting: *Deleted

## 2012-08-28 ENCOUNTER — Other Ambulatory Visit: Payer: Self-pay | Admitting: *Deleted

## 2012-08-28 NOTE — Telephone Encounter (Signed)
Last dispensed on 07/15/12.

## 2012-08-29 MED ORDER — ALPRAZOLAM 0.5 MG PO TABS: 0.5000 mg | ORAL_TABLET | Freq: Three times a day (TID) | ORAL | Status: DC | PRN

## 2012-08-29 NOTE — Telephone Encounter (Signed)
Ok for 1 refill # 90. For continued anxiety and use of alprazolam will need OV to discuss optimal therapy

## 2012-08-30 ENCOUNTER — Telehealth: Payer: Self-pay | Admitting: *Deleted

## 2012-08-30 ENCOUNTER — Other Ambulatory Visit: Payer: Self-pay | Admitting: *Deleted

## 2012-08-30 MED ORDER — ALPRAZOLAM 0.5 MG PO TABS: 0.5000 mg | ORAL_TABLET | Freq: Three times a day (TID) | ORAL | Status: DC | PRN

## 2012-08-30 NOTE — Telephone Encounter (Signed)
Called pt and lvm telling her that Dr Debby Bud is going to fill her alprazolam and that for continued anxiety and use of alprazolam will need OV to discuss optimal therapy. Asked pt to call back and make an appt for an OV.

## 2012-09-09 ENCOUNTER — Other Ambulatory Visit: Payer: Self-pay | Admitting: *Deleted

## 2012-09-09 MED ORDER — ALPRAZOLAM 0.5 MG PO TABS: ORAL_TABLET | ORAL | Status: DC

## 2012-09-09 NOTE — Telephone Encounter (Signed)
Rx was sent to wrong pharmacy

## 2012-11-11 ENCOUNTER — Telehealth: Payer: Self-pay | Admitting: General Practice

## 2012-11-11 NOTE — Telephone Encounter (Signed)
Alprazolam 0.5mg  refill request. Last filled on 12/23 # 90 with 1 refill. Pt last seen 03/18/12. Ok to fill?   Pt takes 1 tablet TID.

## 2012-11-12 NOTE — Telephone Encounter (Signed)
Ok for one month refill to hold until she is seen for office visit follow up.

## 2012-11-13 ENCOUNTER — Other Ambulatory Visit: Payer: Self-pay | Admitting: *Deleted

## 2012-11-13 MED ORDER — ALPRAZOLAM 0.5 MG PO TABS: ORAL_TABLET | ORAL | Status: AC

## 2012-11-13 NOTE — Telephone Encounter (Signed)
Call pt and schedule follow up per Dr Debby Bud. Sending rx today.

## 2012-11-20 NOTE — Telephone Encounter (Signed)
Lvmom for pt to call back schedule follow up

## 2013-05-04 IMAGING — CR DG CHEST 2V
2 series · 2 of 2 positions shown · non-contrast
Comparison: 12/21/2010

CLINICAL DATA: Lower anterior rib pain for 2 days, no known injury

CHEST - 2 VIEW

[w chest pa]
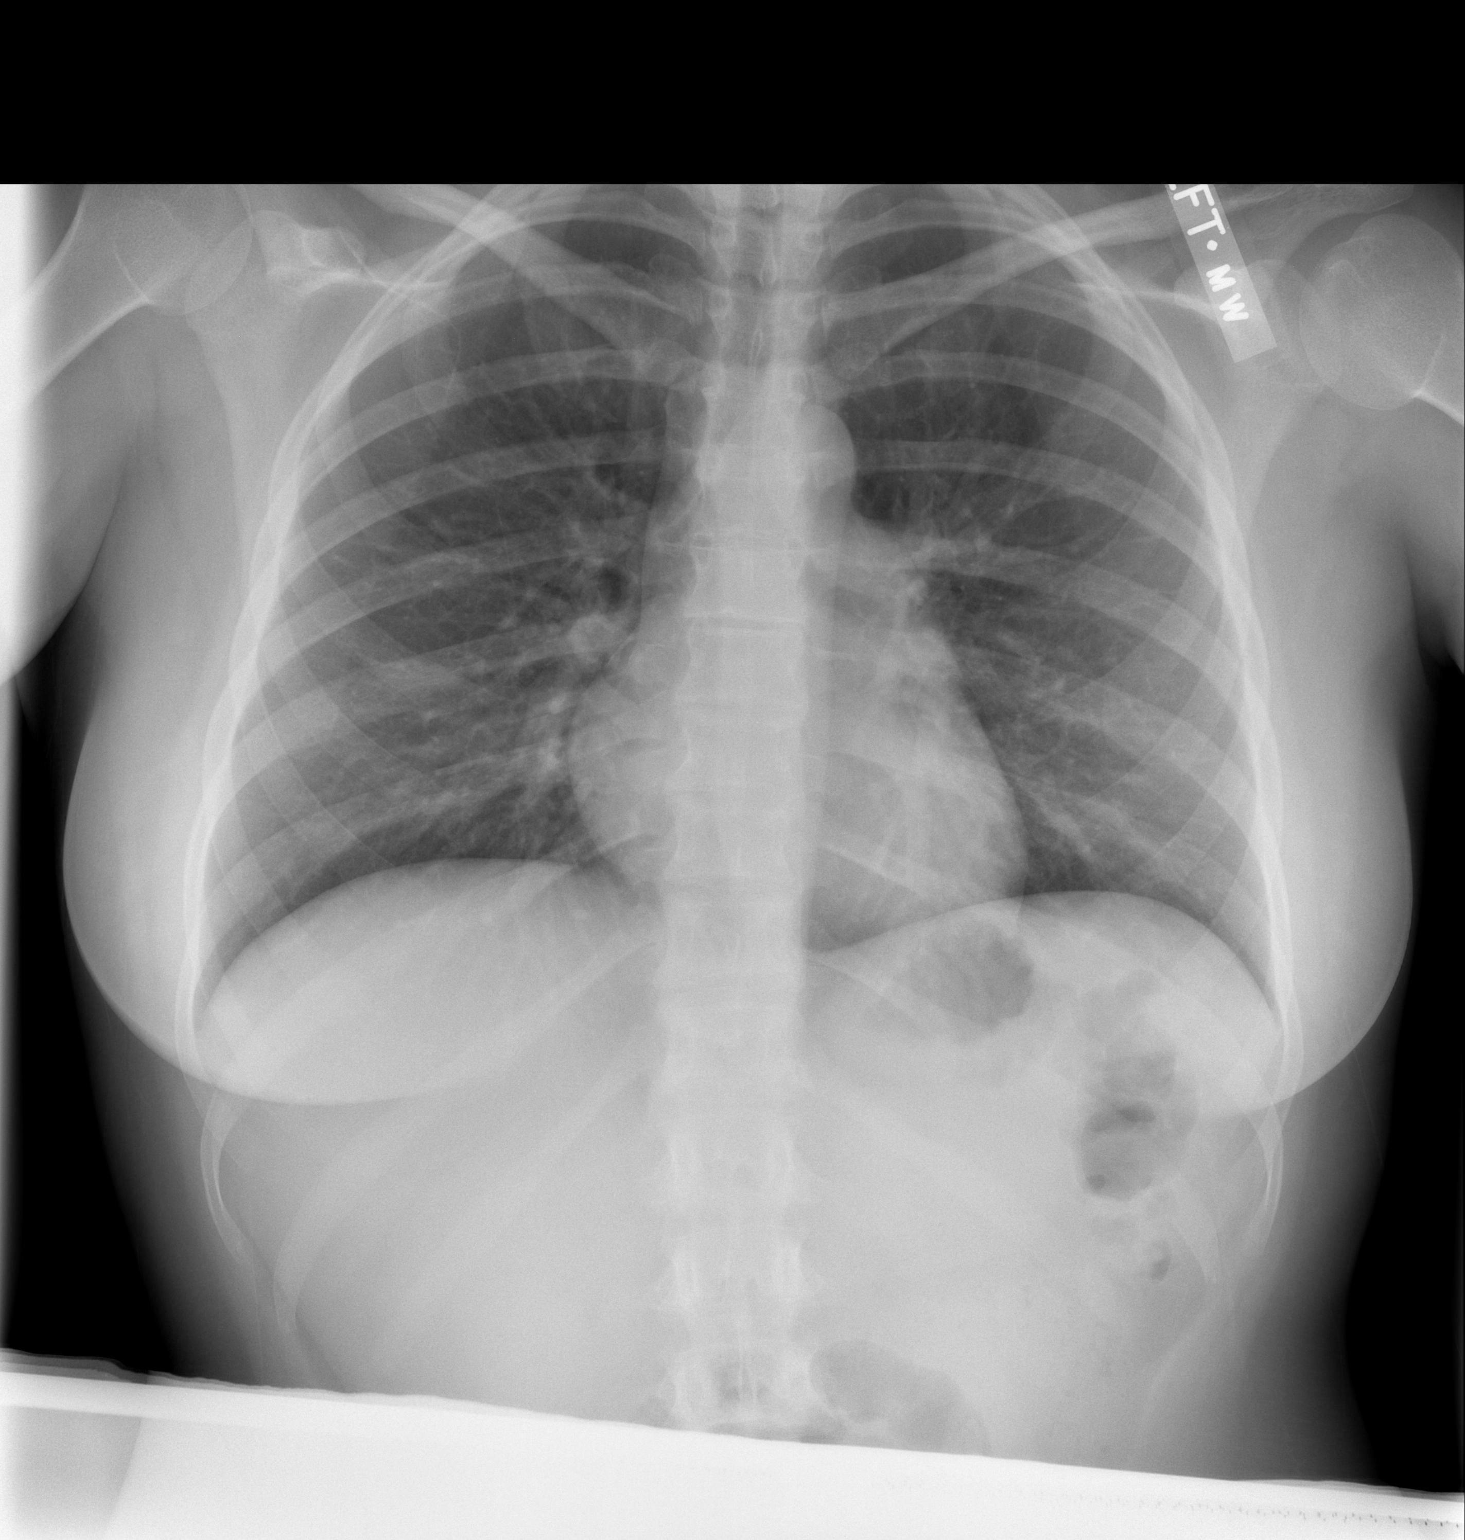

[w chest lat]
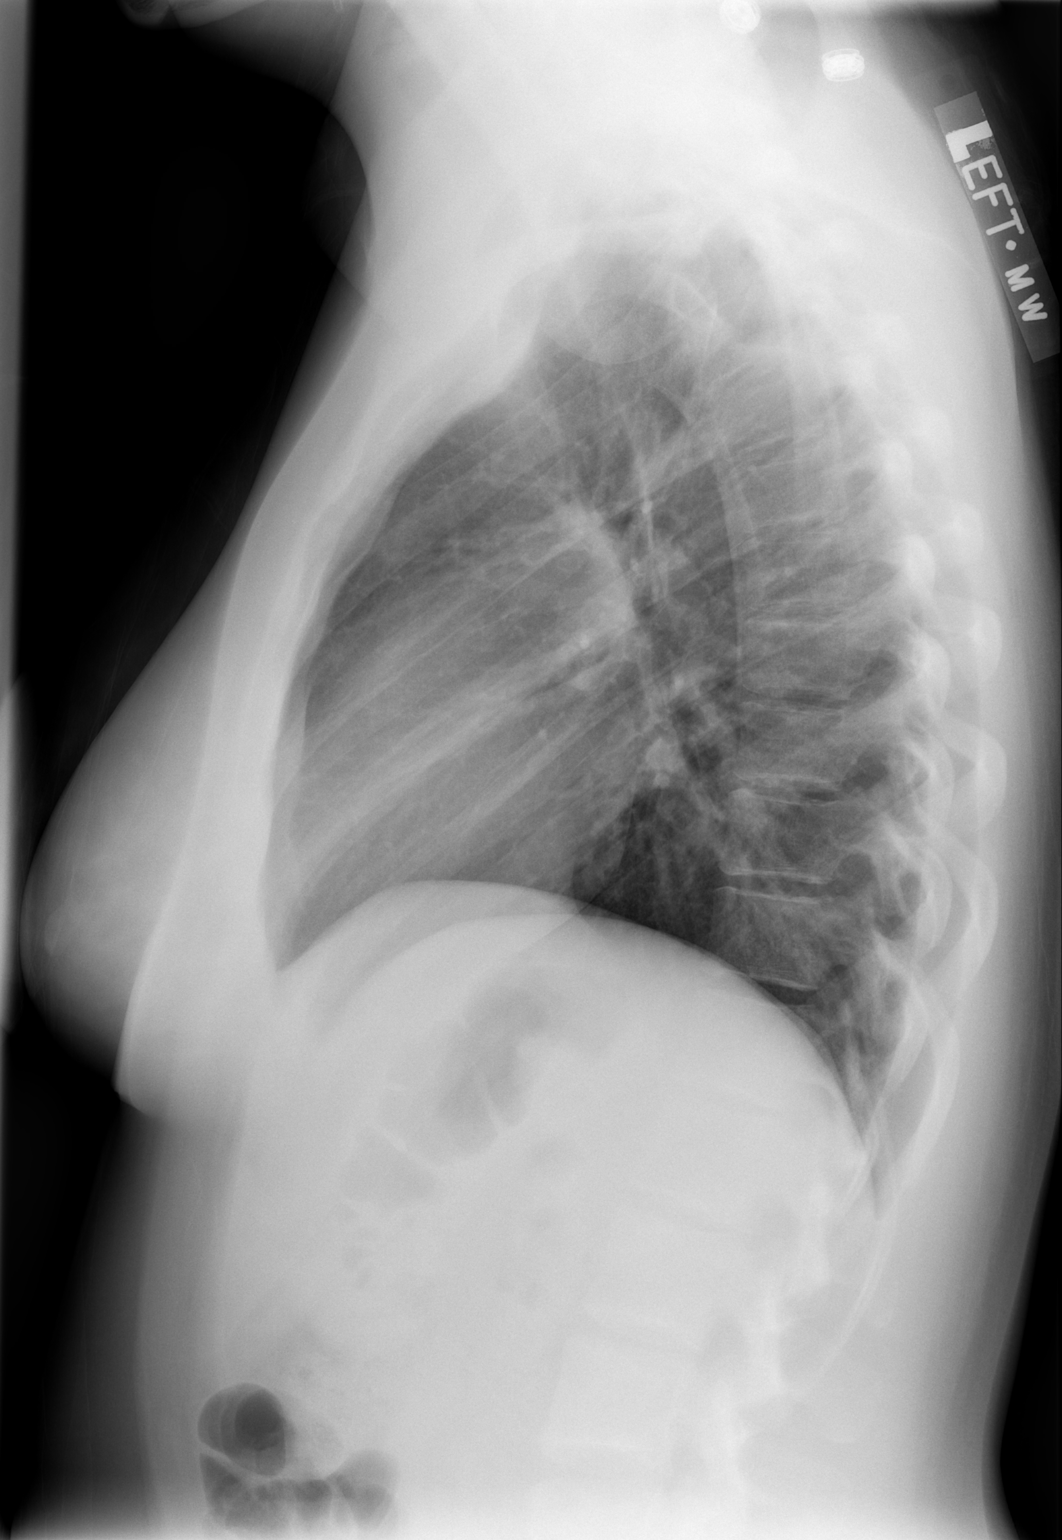

[2 of 2 positions shown; findings below may reference images not displayed]

FINDINGS: Normal heart size, mediastinal contours, and pulmonary vascularity.
Lungs clear.
No pleural effusion or pneumothorax.
Bones unremarkable.
IMPRESSION: No acute abnormalities.

## 2015-03-23 ENCOUNTER — Telehealth: Payer: Self-pay | Admitting: Cardiology

## 2015-03-23 NOTE — Telephone Encounter (Signed)
Received records from [facility] for appointment on 04/28/15 with Dr Antoine PocheHochrein.  [facility] Dr Hochrein's schedule on 04/28/15.--[facility]

## 2015-04-28 ENCOUNTER — Ambulatory Visit: Payer: Self-pay | Admitting: Cardiology
